# Patient Record
Sex: Male | Born: 1970 | Race: White | Hispanic: No | Marital: Married | Smoking: Never smoker
Health system: Southern US, Community
[De-identification: ages and names within clinical notes are randomized; demographics above are authoritative.]

## PROBLEM LIST (undated history)

## (undated) DIAGNOSIS — Z789 Other specified health status: Secondary | ICD-10-CM

## (undated) HISTORY — PX: NO PAST SURGERIES: SHX2092

---

## 2014-12-06 ENCOUNTER — Encounter (HOSPITAL_COMMUNITY): Payer: Self-pay | Admitting: Emergency Medicine

## 2014-12-06 ENCOUNTER — Observation Stay (HOSPITAL_COMMUNITY)
Admission: EM | Admit: 2014-12-06 | Discharge: 2014-12-07 | Disposition: A | Discharge status: Home / Self Care | Payer: Managed Care, Other (non HMO) | Attending: Internal Medicine | Admitting: Internal Medicine

## 2014-12-06 ENCOUNTER — Emergency Department (HOSPITAL_COMMUNITY): Payer: Managed Care, Other (non HMO)

## 2014-12-06 DIAGNOSIS — R9431 Abnormal electrocardiogram [ECG] [EKG]: Secondary | ICD-10-CM | POA: Insufficient documentation

## 2014-12-06 DIAGNOSIS — R0602 Shortness of breath: Secondary | ICD-10-CM | POA: Insufficient documentation

## 2014-12-06 DIAGNOSIS — R110 Nausea: Secondary | ICD-10-CM | POA: Diagnosis not present

## 2014-12-06 DIAGNOSIS — Z8249 Family history of ischemic heart disease and other diseases of the circulatory system: Secondary | ICD-10-CM | POA: Insufficient documentation

## 2014-12-06 DIAGNOSIS — R0789 Other chest pain: Secondary | ICD-10-CM

## 2014-12-06 DIAGNOSIS — Z791 Long term (current) use of non-steroidal anti-inflammatories (NSAID): Secondary | ICD-10-CM | POA: Diagnosis not present

## 2014-12-06 DIAGNOSIS — R079 Chest pain, unspecified: Secondary | ICD-10-CM | POA: Diagnosis not present

## 2014-12-06 DIAGNOSIS — E785 Hyperlipidemia, unspecified: Secondary | ICD-10-CM | POA: Insufficient documentation

## 2014-12-06 DIAGNOSIS — D72829 Elevated white blood cell count, unspecified: Secondary | ICD-10-CM | POA: Insufficient documentation

## 2014-12-06 HISTORY — DX: Other specified health status: Z78.9

## 2014-12-06 LAB — CBC
HEMATOCRIT: 48 % (ref 39.0–52.0)
Hemoglobin: 16.6 g/dL (ref 13.0–17.0)
MCH: 29.9 pg (ref 26.0–34.0)
MCHC: 34.6 g/dL (ref 30.0–36.0)
MCV: 86.3 fL (ref 78.0–100.0)
PLATELETS: 186 10*3/uL (ref 150–400)
RBC: 5.56 MIL/uL (ref 4.22–5.81)
RDW: 12.7 % (ref 11.5–15.5)
WBC: 12.9 10*3/uL — ABNORMAL HIGH (ref 4.0–10.5)

## 2014-12-06 LAB — BASIC METABOLIC PANEL
ANION GAP: 15 (ref 5–15)
BUN: 15 mg/dL (ref 6–23)
CHLORIDE: 101 meq/L (ref 96–112)
CO2: 23 mmol/L (ref 19–32)
CREATININE: 1.01 mg/dL (ref 0.50–1.35)
Calcium: 9.9 mg/dL (ref 8.4–10.5)
GFR calc Af Amer: >90 mL/min (ref >90)
GFR calc non Af Amer: 89 mL/min — ABNORMAL LOW (ref >90)
GLUCOSE: 120 mg/dL — AB (ref 70–99)
Potassium: 4.6 mmol/L (ref 3.5–5.1)
Sodium: 139 mmol/L (ref 135–145)

## 2014-12-06 LAB — I-STAT CG4 LACTIC ACID, ED: Lactic Acid, Venous: 0.84 mmol/L (ref 0.5–2.2)

## 2014-12-06 LAB — I-STAT TROPONIN, ED: TROPONIN I, POC: 0 ng/mL (ref 0.00–0.08)

## 2014-12-06 LAB — D-DIMER, QUANTITATIVE: D-Dimer, Quant: <0.27 ug/mL-FEU (ref 0.00–0.48)

## 2014-12-06 MED ORDER — KETOROLAC TROMETHAMINE 30 MG/ML IJ SOLN
30.0000 mg | Freq: Once | INTRAMUSCULAR | Status: AC
  Administered 2014-12-06: 30 mg via INTRAVENOUS
  Filled 2014-12-06: qty 1

## 2014-12-06 NOTE — ED Provider Notes (Signed)
CSN: [id number]     Arrival date & time 12/06/14  1755 History   First MD Initiated Contact with Patient 12/06/14 2031     Chief Complaint  Patient presents with  . Chest Pain      HPI  Patient presents with concern of ongoing chest pain.  Pain is left-sided, nonradiating, sore. Patient was seen 1 week ago, after the pain began, diagnosed with muscular pain, discharge with muscle relaxants. He notes that since that time he continues to have pain, has stopped using the relaxants secondary to side effects. The pain is nonexertional, but there is new diaphoresis, fatigue with exertion. The patient was well prior to the onset of symptoms, denies notable events previously. He denies travel history, new medications, new diet. Patient does not smoke, drinks socially. No history of cardiac disease.   History reviewed. No pertinent past medical history. History reviewed. No pertinent past surgical history. No family history on file. History  Substance Use Topics  . Smoking status: Never Smoker   . Smokeless tobacco: Not on file  . Alcohol Use: Yes    Review of Systems  Constitutional:       Per HPI, otherwise negative  HENT:       Per HPI, otherwise negative  Respiratory:       Per HPI, otherwise negative  Cardiovascular:       Per HPI, otherwise negative  Gastrointestinal: Negative for vomiting.  Endocrine:       Negative aside from HPI  Genitourinary:       Neg aside from HPI   Musculoskeletal:       Per HPI, otherwise negative  Skin: Negative.   Neurological: Negative for syncope.      Allergies  Review of patient's allergies indicates no known allergies.  Home Medications   Prior to Admission medications   Medication Sig Start Date End Date Taking? Authorizing Provider  cyclobenzaprine (FLEXERIL) 10 MG tablet Take 10 mg by mouth 3 (three) times daily as needed. 11/30/14  Yes Historical Provider, MD  ibuprofen (ADVIL,MOTRIN) 200 MG tablet Take 400 mg by mouth  every 6 (six) hours as needed for moderate pain.    Yes Historical Provider, MD  naproxen (NAPROSYN) 500 MG tablet Take 500 mg by mouth 2 (two) times daily. 11/30/14  Yes Historical Provider, MD   BP 134/88 mmHg  Pulse 76  Temp(Src) 98.2 F (36.8 C) (Oral)  Resp 18  Ht  (1.778 m)  Wt 195 lb (88.451 kg)  BMI 27.98 kg/m2  SpO2 98% Physical Exam  Constitutional: He is oriented to person, place, and time. He appears well-developed. No distress.  HENT:  Head: Normocephalic and atraumatic.  Eyes: Conjunctivae and EOM are normal.  Cardiovascular: Normal rate and regular rhythm.   Pulmonary/Chest: Effort normal. No stridor. No respiratory distress.  Abdominal: He exhibits no distension.  Musculoskeletal: He exhibits no edema.  Neurological: He is alert and oriented to person, place, and time.  Skin: Skin is warm and dry.  Psychiatric: He has a normal mood and affect.  Nursing note and vitals reviewed.   ED Course  Procedures (including critical care time) Labs Review Labs Reviewed  CBC - Abnormal; Notable for the following:    WBC 12.9 (*)    All other components within normal limits  BASIC METABOLIC PANEL - Abnormal; Notable for the following:    Glucose, Bld 120 (*)    GFR calc non Af Amer 89 (*)    All other components  within normal limits  D-DIMER, QUANTITATIVE  I-STAT TROPOININ, ED  I-STAT CG4 LACTIC ACID, ED    Imaging Review Dg Chest 2 View  12/06/2014   CLINICAL DATA:  LEFT chest pain radiating to arm. Shortness of breath, nausea.  EXAM: CHEST  2 VIEW  COMPARISON:  None.  FINDINGS: Cardiomediastinal silhouette is unremarkable. The lungs are clear without pleural effusions or focal consolidations. Trachea projects midline and there is no pneumothorax. Soft tissue planes and included osseous structures are non-suspicious.  IMPRESSION: Normal chest.   Electronically Signed   By: Awilda Metroourtnay  Bloomer   On: 12/06/2014 22:07     EKG Interpretation   Date/Time:  Sunday  December 06 2014 17:59:38 EST Ventricular Rate:  88 PR Interval:  154 QRS Duration: 88 QT Interval:  330 QTC Calculation: 399 R Axis:   42 Text Interpretation:  Normal sinus rhythm ST \\T\\ T wave abnormality,  consider inferior ischemia Abnormal ECG Sinus rhythm ST-t wave abnormality  Abnormal ekg Confirmed by Vennessa Affinito  MD ([id number]) on 12/06/2014 8:38:08  PM     10 :46 PM    MDM   Final diagnoses:  Atypical chest pain   patient presents with ongoing left-sided chest pain, as well as new exertional diaphoresis. Here the patient's initial evaluation is reassuring, with negative troponin, d-dimer, reassuring labs, x-ray. However, given the patient's absence of prior workup, his new exertional symptoms, he was admitted for further evaluation and management.    Gerhard Munchobert Easter Schinke, MD 12/06/14 [phone]

## 2014-12-06 NOTE — ED Notes (Signed)
Pt c/o left sided chest pain intermittent for several days. Pt seen at urgent care and was told it was muscular. Pt was given muscle relaxers. Pt reports muscle relaxers made him feel out of it so he stopped taking them.

## 2014-12-07 ENCOUNTER — Encounter (HOSPITAL_COMMUNITY): Payer: Self-pay | Admitting: Internal Medicine

## 2014-12-07 DIAGNOSIS — E785 Hyperlipidemia, unspecified: Secondary | ICD-10-CM | POA: Diagnosis present

## 2014-12-07 DIAGNOSIS — R079 Chest pain, unspecified: Secondary | ICD-10-CM

## 2014-12-07 DIAGNOSIS — D72829 Elevated white blood cell count, unspecified: Secondary | ICD-10-CM | POA: Diagnosis present

## 2014-12-07 DIAGNOSIS — R0789 Other chest pain: Secondary | ICD-10-CM | POA: Insufficient documentation

## 2014-12-07 DIAGNOSIS — R072 Precordial pain: Secondary | ICD-10-CM

## 2014-12-07 LAB — COMPREHENSIVE METABOLIC PANEL
ALK PHOS: 45 U/L (ref 39–117)
ALT: 38 U/L (ref 0–53)
ANION GAP: 12 (ref 5–15)
AST: 42 U/L — ABNORMAL HIGH (ref 0–37)
Albumin: 4.1 g/dL (ref 3.5–5.2)
BUN: 17 mg/dL (ref 6–23)
CHLORIDE: 102 meq/L (ref 96–112)
CO2: 23 mmol/L (ref 19–32)
CREATININE: 1.13 mg/dL (ref 0.50–1.35)
Calcium: 9.1 mg/dL (ref 8.4–10.5)
GFR calc Af Amer: >90 mL/min (ref >90)
GFR calc non Af Amer: 78 mL/min — ABNORMAL LOW (ref >90)
Glucose, Bld: 142 mg/dL — ABNORMAL HIGH (ref 70–99)
Potassium: 3.6 mmol/L (ref 3.5–5.1)
SODIUM: 137 mmol/L (ref 135–145)
TOTAL PROTEIN: 6.7 g/dL (ref 6.0–8.3)
Total Bilirubin: 0.7 mg/dL (ref 0.3–1.2)

## 2014-12-07 LAB — CBC WITH DIFFERENTIAL/PLATELET
BASOS ABS: 0 10*3/uL (ref 0.0–0.1)
Basophils Relative: 0 % (ref 0–1)
Eosinophils Absolute: 0.3 10*3/uL (ref 0.0–0.7)
Eosinophils Relative: 2 % (ref 0–5)
HCT: 44.9 % (ref 39.0–52.0)
Hemoglobin: 15.4 g/dL (ref 13.0–17.0)
Lymphocytes Relative: 23 % (ref 12–46)
Lymphs Abs: 2.8 10*3/uL (ref 0.7–4.0)
MCH: 29.7 pg (ref 26.0–34.0)
MCHC: 34.3 g/dL (ref 30.0–36.0)
MCV: 86.5 fL (ref 78.0–100.0)
MONO ABS: 1.2 10*3/uL — AB (ref 0.1–1.0)
Monocytes Relative: 10 % (ref 3–12)
Neutro Abs: 7.9 10*3/uL — ABNORMAL HIGH (ref 1.7–7.7)
Neutrophils Relative %: 65 % (ref 43–77)
Platelets: 178 10*3/uL (ref 150–400)
RBC: 5.19 MIL/uL (ref 4.22–5.81)
RDW: 12.9 % (ref 11.5–15.5)
WBC: 12.1 10*3/uL — ABNORMAL HIGH (ref 4.0–10.5)

## 2014-12-07 LAB — CREATININE, SERUM
Creatinine, Ser: 1.13 mg/dL (ref 0.50–1.35)
GFR calc Af Amer: >90 mL/min (ref >90)
GFR, EST NON AFRICAN AMERICAN: 78 mL/min — AB (ref >90)

## 2014-12-07 LAB — RAPID URINE DRUG SCREEN, HOSP PERFORMED
AMPHETAMINES: NOT DETECTED
BENZODIAZEPINES: NOT DETECTED
Barbiturates: NOT DETECTED
COCAINE: NOT DETECTED
OPIATES: NOT DETECTED
Tetrahydrocannabinol: NOT DETECTED

## 2014-12-07 LAB — LIPID PANEL
CHOLESTEROL: 251 mg/dL — AB (ref 0–200)
HDL: 34 mg/dL — ABNORMAL LOW (ref >39)
LDL CALC: 177 mg/dL — AB (ref 0–99)
Total CHOL/HDL Ratio: 7.4 RATIO
Triglycerides: 198 mg/dL — ABNORMAL HIGH (ref <150)
VLDL: 40 mg/dL (ref 0–40)

## 2014-12-07 LAB — TROPONIN I
Troponin I: <0.03 ng/mL (ref <0.031)
Troponin I: <0.03 ng/mL (ref <0.031)

## 2014-12-07 MED ORDER — ASPIRIN EC 325 MG PO TBEC
325.0000 mg | DELAYED_RELEASE_TABLET | Freq: Every day | ORAL | Status: DC
  Administered 2014-12-07: 325 mg via ORAL
  Filled 2014-12-07: qty 1

## 2014-12-07 MED ORDER — ONDANSETRON HCL 4 MG/2ML IJ SOLN: 4.0000 mg | Freq: Four times a day (QID) | INTRAMUSCULAR | Status: DC | PRN

## 2014-12-07 MED ORDER — SODIUM CHLORIDE 0.9 % IV SOLN
INTRAVENOUS | Status: DC
  Administered 2014-12-07: 03:00:00 via INTRAVENOUS

## 2014-12-07 MED ORDER — MORPHINE SULFATE 2 MG/ML IJ SOLN: 2.0000 mg | INTRAMUSCULAR | Status: DC | PRN

## 2014-12-07 MED ORDER — PRAVASTATIN SODIUM 10 MG PO TABS: 10.0000 mg | ORAL_TABLET | Freq: Every day | ORAL | Status: AC

## 2014-12-07 MED ORDER — POTASSIUM CHLORIDE 10 MEQ/100ML IV SOLN: 10.0000 meq | Freq: Once | INTRAVENOUS | Status: DC

## 2014-12-07 MED ORDER — ACETAMINOPHEN 325 MG PO TABS: 650.0000 mg | ORAL_TABLET | ORAL | Status: DC | PRN

## 2014-12-07 MED ORDER — POTASSIUM CHLORIDE 10 MEQ/100ML IV SOLN: 10.0000 meq | INTRAVENOUS | Status: DC

## 2014-12-07 MED ORDER — SODIUM CHLORIDE 0.9 % IV SOLN
INTRAVENOUS | Status: AC
  Administered 2014-12-07: 02:00:00 via INTRAVENOUS

## 2014-12-07 MED ORDER — ENOXAPARIN SODIUM 40 MG/0.4ML ~~LOC~~ SOLN
40.0000 mg | Freq: Every day | SUBCUTANEOUS | Status: DC
  Administered 2014-12-07: 40 mg via SUBCUTANEOUS
  Filled 2014-12-07 (×2): qty 0.4

## 2014-12-07 NOTE — ED Notes (Signed)
Pt updated on plan of care. Awaiting disposition. Pt denies chest pain at this time

## 2014-12-07 NOTE — ED Notes (Signed)
Spoke with Dr. Rito EhrlichKrishnan and he advised okay to cancel bed request.   Patient will be discharged after he gets troponin back and after echo, provided all shows well.

## 2014-12-07 NOTE — Progress Notes (Signed)
*  PRELIMINARY RESULTS* Echocardiogram 2D Echocardiogram has been performed.  Jeryl ColumbiaLLIOTT, [place] 12/07/2014, 2:17 PM

## 2014-12-07 NOTE — Progress Notes (Signed)
UR completed 

## 2014-12-07 NOTE — ED Notes (Signed)
Pt made aware to return if symptoms worsen or if any life threatening symptoms occur.   

## 2014-12-07 NOTE — ED Notes (Signed)
Bedside echo being performed at this time

## 2014-12-07 NOTE — Discharge Summary (Signed)
Discharge Summary  Alexander DissChristopher D Sullivan ZOX:[id number] DOB: 07-19-1971  PCP: Patient with no primary care physician, given referrals  Admit date: 12/06/2014 Discharge date: 12/07/2014  Time spent: 35 minutes  Recommendations for Outpatient Follow-up:  1. New medication: Pravastatin 10 mg by mouth daily at bedtime   Discharge Diagnoses:  Active Hospital Problems   Diagnosis Date Noted  . Chest pain 12/06/2014  . Leucocytosis 12/07/2014  . Hyperlipidemia 12/07/2014    Resolved Hospital Problems   Diagnosis Date Noted Date Resolved  No resolved problems to display.    Discharge Condition: Improved, being discharged home  Diet recommendation: Regular diet  Filed Weights   12/06/14 1801  Weight: 88.451 kg (195 lb)    History of present illness:  44 year old male with no past medical history came to the emergency room on 1/10 night with complaints of left-sided chest pain 1 week. Described as both a sharp pain but also pressure and he was concerned what it felt like he was moving down his left hand. No associated shortness of breath. EKG noted questionable T-wave inversion in the inferior leads. Cardiac markers initially were negative.  Hospital Course:  Principal Problem:   Chest pain: Patient's heart score was 2. Enzymes 3 negative. Echocardiogram done unrevealing. Repeat EKG was normal. Lipid panel fasting noted LDL of 177. Patient's chest pain felt to be more musculoskeletal versus anxiety. D-dimer normal. He was discharged home. Active Problems:   Leucocytosis: Afebrile with no shift. Possible reactionary, no evidence of pneumonia on chest x-ray   Hyperlipidemia: Given LDL, prescription for pravastatin given   Procedures:  Echocardiogram done 1/11: No evidence of systolic or diastolic dysfunction, no valvular abnormalities. Normal.  Consultations:  None  Discharge Exam: BP 120/70 mmHg  Pulse 76  Temp(Src) 98.4 F (36.9 C) (Oral)  Resp 16  Ht 5\' 10"  (1.778 m)   Wt 88.451 kg (195 lb)  BMI 27.98 kg/m2  SpO2 96%  General: Alert and oriented 3, no acute distress Cardiovascular: Regular rate and rhythm, S1-S2 Respiratory: Clear to auscultation bilaterally  Discharge Instructions You were cared for by a hospitalist during your hospital stay. If you have any questions about your discharge medications or the care you received while you were in the hospital after you are discharged, you can call the unit and asked to speak with the hospitalist on call if the hospitalist that took care of you is not available. Once you are discharged, your primary care physician will handle any further medical issues. Please note that NO REFILLS for any discharge medications will be authorized once you are discharged, as it is imperative that you return to your primary care physician (or establish a relationship with a primary care physician if you do not have one) for your aftercare needs so that they can reassess your need for medications and monitor your lab values.  Discharge Instructions    Diet general    Complete by:  As directed      Increase activity slowly    Complete by:  As directed             Medication List    STOP taking these medications        ibuprofen 200 MG tablet  Commonly known as:  ADVIL,MOTRIN     naproxen 500 MG tablet  Commonly known as:  NAPROSYN      TAKE these medications        cyclobenzaprine 10 MG tablet  Commonly known as:  FLEXERIL  Take 10  mg by mouth 3 (three) times daily as needed.     pravastatin 10 MG tablet  Commonly known as:  PRAVACHOL  Take 1 tablet (10 mg total) by mouth daily.       No Known Allergies    The results of significant diagnostics from this hospitalization (including imaging, microbiology, ancillary and laboratory) are listed below for reference.    Significant Diagnostic Studies: Dg Chest 2 View  12/06/2014   CLINICAL DATA:  LEFT chest pain radiating to arm. Shortness of breath, nausea.   EXAM: CHEST  2 VIEW  COMPARISON:  None.  FINDINGS: Cardiomediastinal silhouette is unremarkable. The lungs are clear without pleural effusions or focal consolidations. Trachea projects midline and there is no pneumothorax. Soft tissue planes and included osseous structures are non-suspicious.  IMPRESSION: Normal chest.   Electronically Signed   By: Awilda Metro   On: 12/06/2014 22:07    Microbiology: No results found for this or any previous visit (from the past 240 hour(s)).   Labs: Basic Metabolic Panel:  Recent Labs Lab 12/06/14 1838 12/07/14 0230  NA 139 137  K 4.6 3.6  CL 101 102  CO2 23 23  GLUCOSE 120* 142*  BUN 15 17  CREATININE 1.01 1.13  1.13  CALCIUM 9.9 9.1   Liver Function Tests:  Recent Labs Lab 12/07/14 0230  AST 42*  ALT 38  ALKPHOS 45  BILITOT 0.7  PROT 6.7  ALBUMIN 4.1   No results for input(s): LIPASE, AMYLASE in the last 168 hours. No results for input(s): AMMONIA in the last 168 hours. CBC:  Recent Labs Lab 12/06/14 1838 12/07/14 0230  WBC 12.9* 12.1*  NEUTROABS  --  7.9*  HGB 16.6 15.4  HCT 48.0 44.9  MCV 86.3 86.5  PLT 186 178   Cardiac Enzymes:  Recent Labs Lab 12/07/14 0230 12/07/14 0801 12/07/14 1415  TROPONINI <0.03 <0.03 <0.03   BNP: BNP (last 3 results) No results for input(s): PROBNP in the last 8760 hours. CBG: No results for input(s): GLUCAP in the last 168 hours.     Signed:  Hollice Espy  [facility] 12/07/2014, 8:10 PM

## 2014-12-07 NOTE — H&P (Signed)
[facility] History and Physical  Alexander Sullivan [id number] DOB: 08/26/1971 DOA: 12/06/2014  Referring physician: ER physician. PCP: No primary care provider on file.   Chief Complaint: Chest pain.  HPI: Alexander Sullivan is a 44 y.o. male with no significant past medical history presents to the ER because of chest pain. Patient states he has been having chest pain over the last 1 week. 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 Patient had initially chest pain which increased on moving his left hand and had gone to urgent care and was placed on NSAIDs and muscle relaxant. Despite taking which patient was still getting chest pain which is mostly left-sided pressure-like nonradiating. Denies any associated shortness of breath. History patient also had some brief episode of diaphoresis. EKG was showing T-wave inversion in the inferior leads. Cardiac markers were negative and patient is currently chest pain-free and admitted for further observation. Patient denies any abdominal pain vomiting or diarrhea. Denies any fever chills or productive cough.   Review of Systems: As presented in the history of presenting illness, rest negative.  Past Medical History  Diagnosis Date  . Medical history non-contributory    Past Surgical History  Procedure Laterality Date  . No past surgeries     Social History:  reports that he has never smoked. He does not have any smokeless tobacco history on file. He reports that he drinks alcohol. He reports that he does not use illicit drugs. Where does patient live home. Can patient participate in ADLs? Yes.  No Known Allergies  Family History:  Family History  Problem Relation Age of Onset  . CAD Neg Hx       Prior to Admission medications   Medication Sig Start Date End Date Taking? Authorizing Provider  cyclobenzaprine (FLEXERIL) 10 MG tablet Take 10 mg by mouth 3 (three) times daily as needed. 11/30/14  Yes Historical Provider, MD  ibuprofen  (ADVIL,MOTRIN) 200 MG tablet Take 400 mg by mouth every 6 (six) hours as needed for moderate pain.    Yes Historical Provider, MD  naproxen (NAPROSYN) 500 MG tablet Take 500 mg by mouth 2 (two) times daily. 11/30/14  Yes Historical Provider, MD    Physical Exam: Filed Vitals:   12/06/14 2100 12/06/14 2130 12/07/14 0011 12/07/14 0030  BP: 122/74 119/78 129/78 118/76  Pulse: 70 67 81 73  Temp:      TempSrc:   Oral   Resp: 12 12 18 17   Height:      Weight:      SpO2: 97% 96% 94% 94%     General:  Well-developed and nourished.  Eyes: Anicteric no pallor.  ENT: No discharge from the ears eyes nose and mouth.  Neck: No JVD appreciated no mass felt.  Cardiovascular: S1 and S2 heard.  Respiratory: No rhonchi or crepitations.  Abdomen: Soft nontender bowel sounds present.  Skin: No rash.  Musculoskeletal: No edema.  Psychiatric: Appears normal.  Neurologic: Alert awake oriented to time place and person. Moves all extremities.  Labs on Admission:  Basic Metabolic Panel:  Recent Labs Lab 12/06/14 1838  NA 139  K 4.6  CL 101  CO2 23  GLUCOSE 120*  BUN 15  CREATININE 1.01  CALCIUM 9.9   Liver Function Tests: No results for input(s): AST, ALT, ALKPHOS, BILITOT, PROT, ALBUMIN in the last 168 hours. No results for input(s): LIPASE, AMYLASE in the last 168 hours. No results for input(s): AMMONIA in the last 168 hours. CBC:  Recent Labs  Lab 12/06/14 1838  WBC 12.9*  HGB 16.6  HCT 48.0  MCV 86.3  PLT 186   Cardiac Enzymes: No results for input(s): CKTOTAL, CKMB, CKMBINDEX, TROPONINI in the last 168 hours.  BNP (last 3 results) No results for input(s): PROBNP in the last 8760 hours. CBG: No results for input(s): GLUCAP in the last 168 hours.  Radiological Exams on Admission: Dg Chest 2 View  12/06/2014   CLINICAL DATA:  LEFT chest pain radiating to arm. Shortness of breath, nausea.  EXAM: CHEST  2 VIEW  COMPARISON:  None.  FINDINGS: Cardiomediastinal  silhouette is unremarkable. The lungs are clear without pleural effusions or focal consolidations. Trachea projects midline and there is no pneumothorax. Soft tissue planes and included osseous structures are non-suspicious.  IMPRESSION: Normal chest.   Electronically Signed   By: Awilda Metro   On: 12/06/2014 22:07    EKG: Independently reviewed. Normal sinus rhythm with T-wave inversion in lead 2 and aVF.  Assessment/Plan Principal Problem:   Chest pain Active Problems:   Leucocytosis   1. Chest pain - given that patient has had no relief with NSAIDs and has had some diaphoresis today with chest pain with some nonspecific T-wave changes in inferior leads we'll cycle cardiac markers to rule out ACS. Check 2-D echo. Keep patient nothing by mouth past 5 AM. For possible cardiac procedures. 2. Leukocytosis - probably reactionary. Patient is afebrile. Closely follow.   DVT Prophylaxis Lovenox.  Code Status: Full code.  Family Communication: Patient's wife at the bedside.  Disposition Plan: Admit for observation.    Tremane Spurgeon N. [facility] Pager [phone].  If 7PM-7AM, please contact night-coverage [email or web address] Password [email or web address] 12/07/2014, 1:25 AM

## 2014-12-07 NOTE — ED Notes (Signed)
Spoke with echo and they will come get patient some time before 1400 today.

## 2015-02-01 ENCOUNTER — Other Ambulatory Visit (INDEPENDENT_AMBULATORY_CARE_PROVIDER_SITE_OTHER): Payer: Self-pay | Admitting: General Surgery

## 2015-02-01 ENCOUNTER — Other Ambulatory Visit (INDEPENDENT_AMBULATORY_CARE_PROVIDER_SITE_OTHER): Payer: Self-pay | Admitting: *Deleted

## 2015-02-01 DIAGNOSIS — R1011 Right upper quadrant pain: Secondary | ICD-10-CM

## 2015-02-01 NOTE — Addendum Note (Signed)
Addended by: Ernestene MentionINGRAM, Keyla Milone M on: 02/01/2015 06:10 PM   Modules accepted: Orders

## 2015-02-02 ENCOUNTER — Other Ambulatory Visit (INDEPENDENT_AMBULATORY_CARE_PROVIDER_SITE_OTHER): Payer: Self-pay | Admitting: *Deleted

## 2015-02-02 DIAGNOSIS — R109 Unspecified abdominal pain: Secondary | ICD-10-CM

## 2015-02-02 DIAGNOSIS — R1011 Right upper quadrant pain: Secondary | ICD-10-CM

## 2015-02-05 ENCOUNTER — Ambulatory Visit
Admission: RE | Admit: 2015-02-05 | Discharge: 2015-02-05 | Disposition: A | Discharge status: Home / Self Care | Payer: Managed Care, Other (non HMO) | Source: Ambulatory Visit | Attending: General Surgery | Admitting: General Surgery

## 2015-03-10 ENCOUNTER — Other Ambulatory Visit: Payer: Self-pay | Admitting: General Surgery

## 2016-02-09 IMAGING — RF DG UGI W/ HIGH DENSITY W/KUB
19 of 24 series · 19 of 24 positions shown · non-contrast
Comparison: Chest radiographs 12/06/2014.

CLINICAL DATA: 44-year-old male with right side abdominal pain.
Planned cholecystectomy. Initial encounter.

EXAM:
UPPER GI SERIES WITH KUB
TECHNIQUE: After obtaining a scout radiograph a routine upper GI series was
performed using barium
FLUOROSCOPY TIME:  Radiation Exposure Index (as provided by the
fluoroscopic device): 81 d8NcmY
If the device does not provide the exposure index:
Fluoroscopy Time (in minutes and seconds):  2 minutes and 36 seconds
Number of Acquired Images:  To

[Series 1: run · 1 of 1 slices shown (1 of 19)]
[im 1/1]
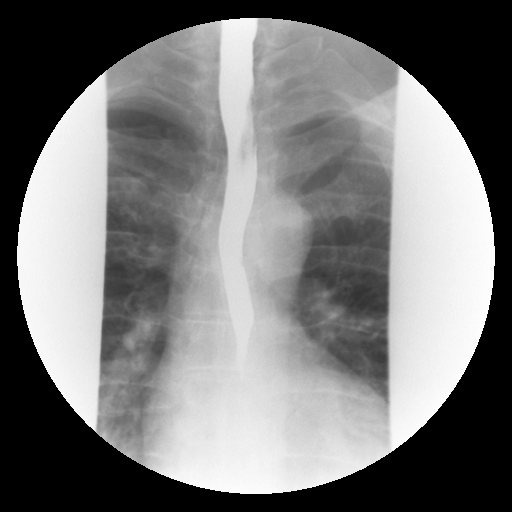

[Series 2: run · 1 of 1 slices shown (2 of 19)]
[im 1/1]
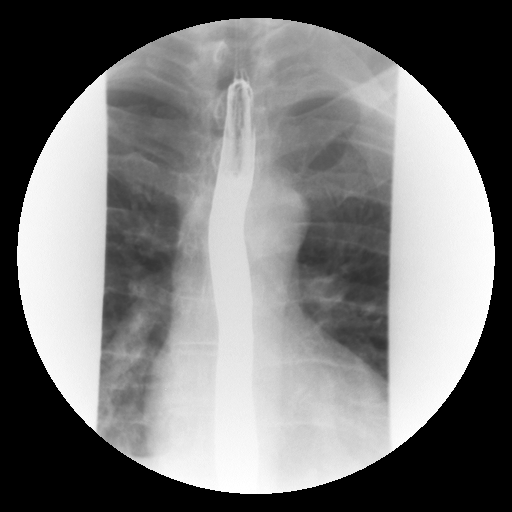

[Series 4: run · 1 of 1 slices shown (3 of 19)]
[im 1/1]
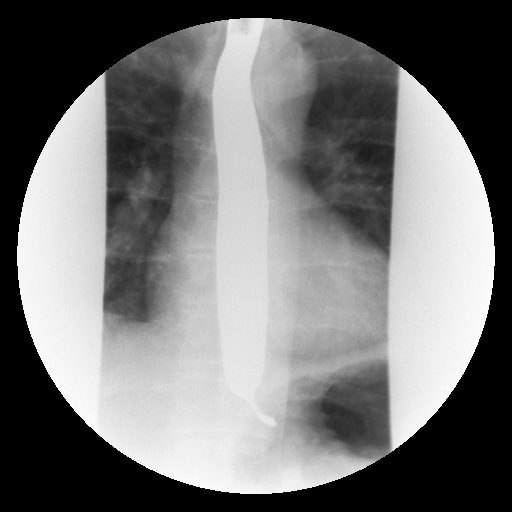

[Series 5: run · 1 of 1 slices shown (4 of 19)]
[im 1/1]
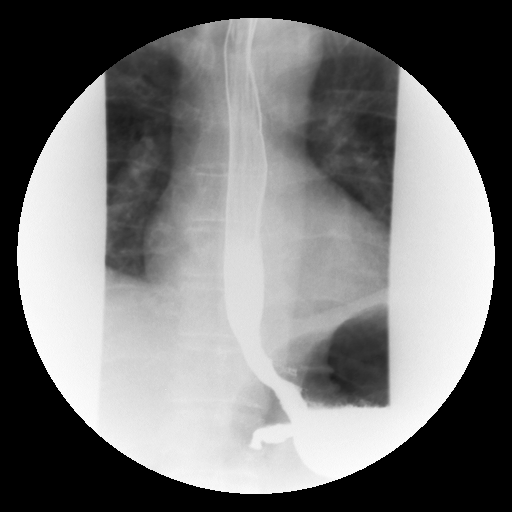

[Series 6: run · 1 of 1 slices shown (5 of 19)]
[im 1/1]
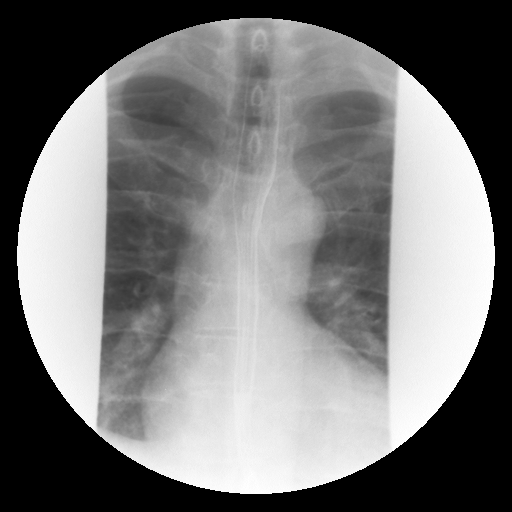

[Series 7: run · 1 of 1 slices shown (6 of 19)]
[im 1/1]
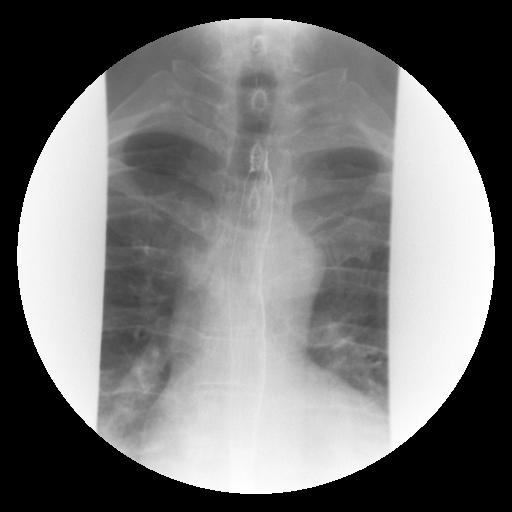

[Series 9: run · 1 of 1 slices shown (7 of 19)]
[im 1/1]
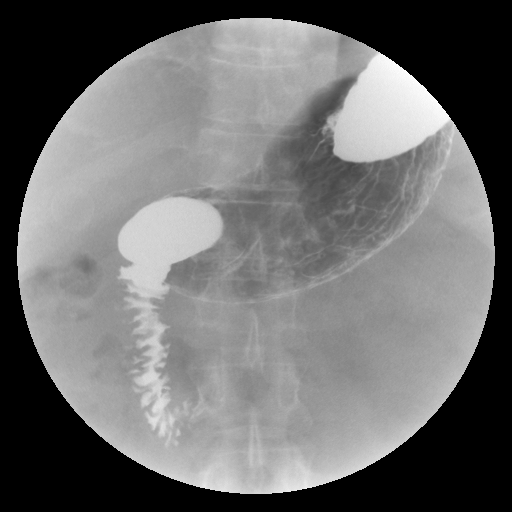

[Series 10: run · 1 of 1 slices shown (8 of 19)]
[im 1/1]
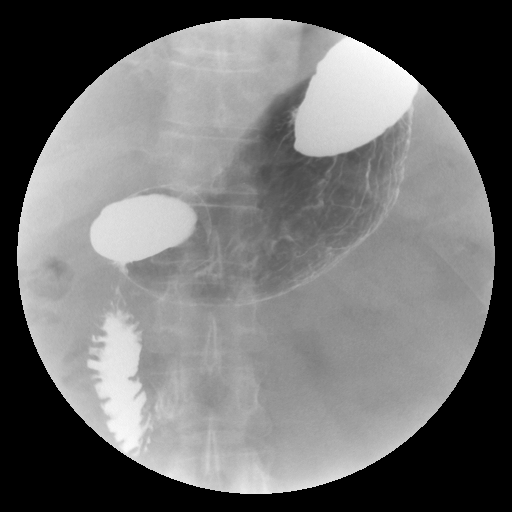

[Series 11: run · 1 of 1 slices shown (9 of 19)]
[im 1/1]
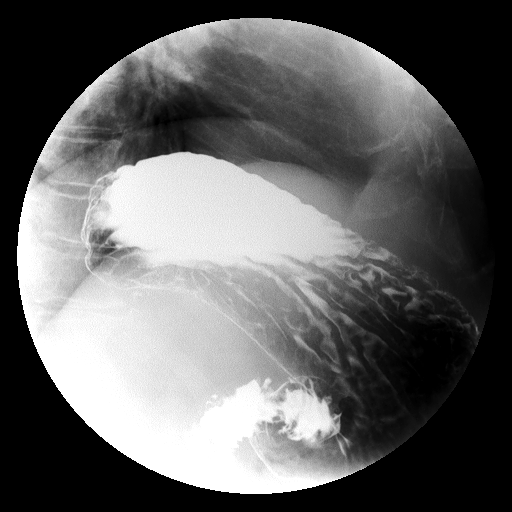

[Series 13: run · 1 of 1 slices shown (10 of 19)]
[im 1/1]
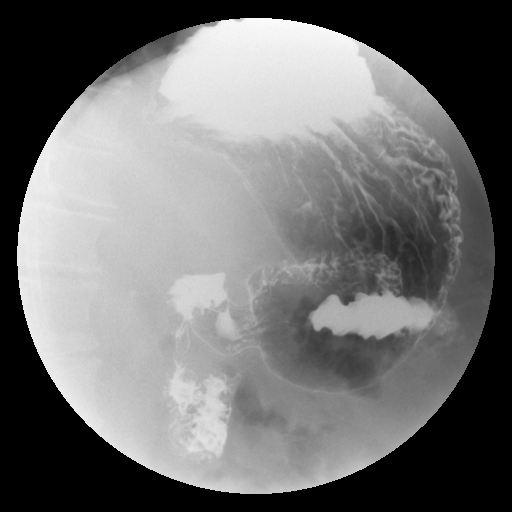

[Series 14: run · 1 of 1 slices shown (11 of 19)]
[im 1/1]
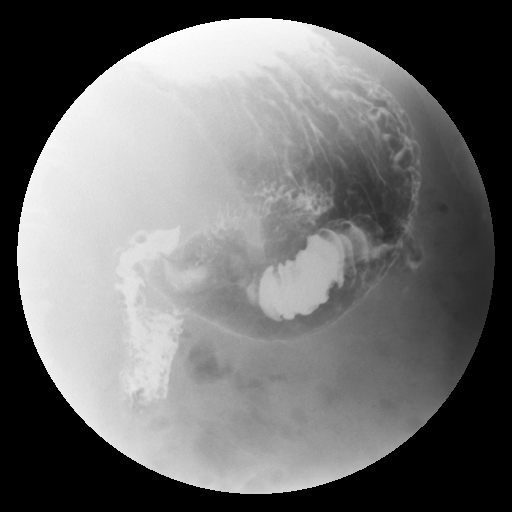

[Series 15: run · 1 of 1 slices shown (12 of 19)]
[im 1/1]
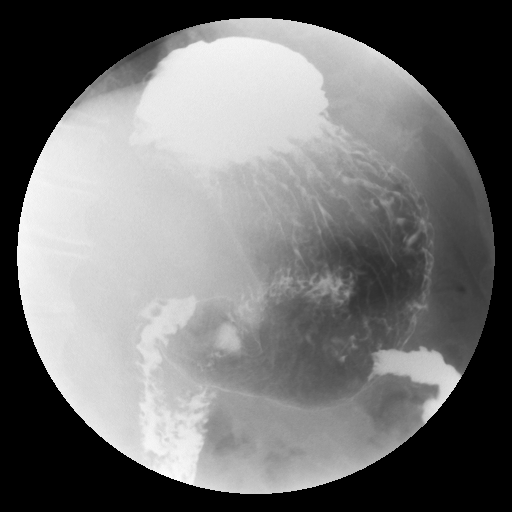

[Series 16: run · 1 of 1 slices shown (13 of 19)]
[im 1/1]
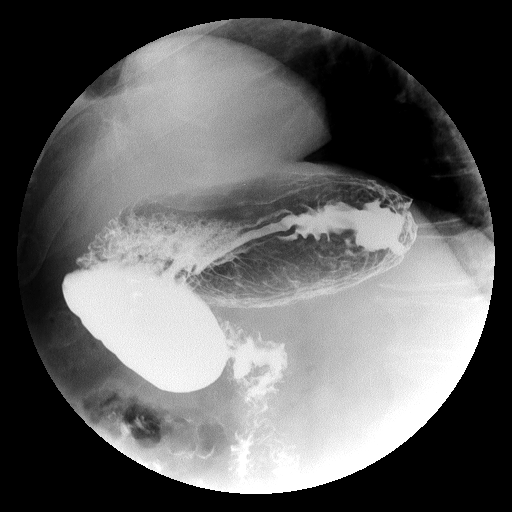

[Series 18: run · 1 of 1 slices shown (14 of 19)]
[im 1/1]
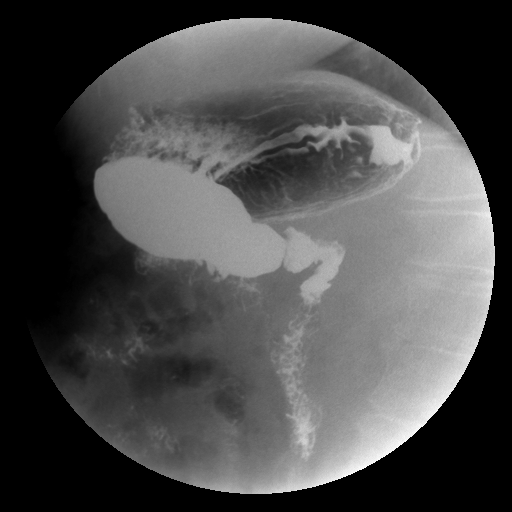

[Series 19: run · 1 of 1 slices shown (15 of 19)]
[im 1/1]
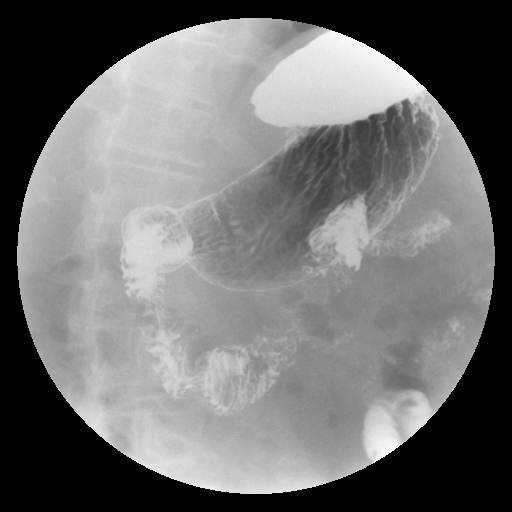

[Series 20: run · 1 of 1 slices shown (16 of 19)]
[im 1/1]
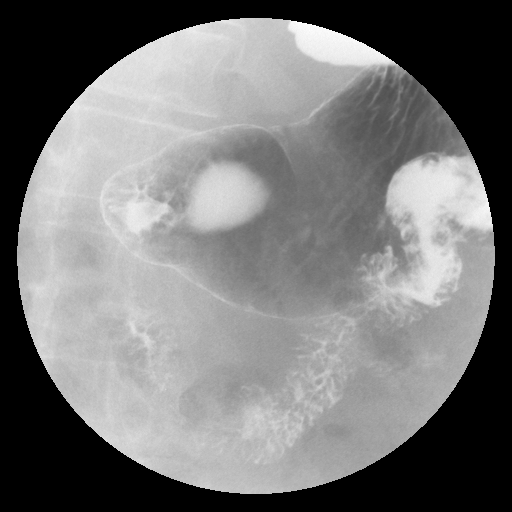

[Series 21: run · 1 of 1 slices shown (17 of 19)]
[im 1/1]
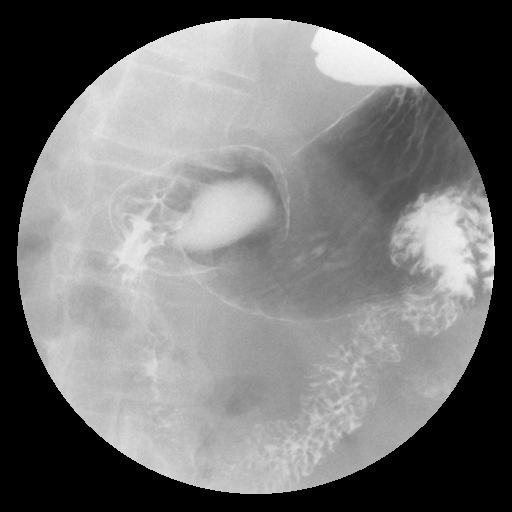

[Series 23: run · 1 of 1 slices shown (18 of 19)]
[im 1/1]
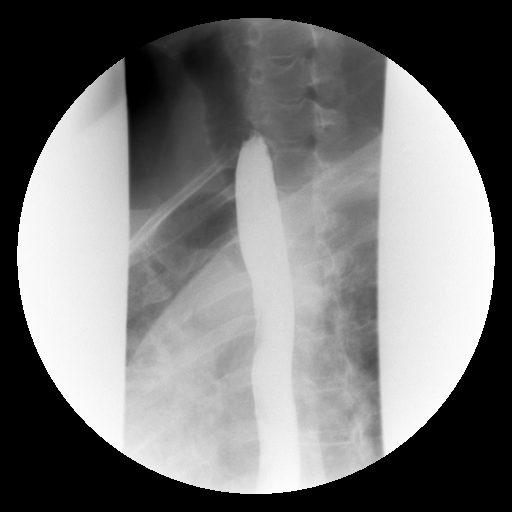

[Series 24: run · 1 of 1 slices shown (19 of 19)]
[im 1/1]
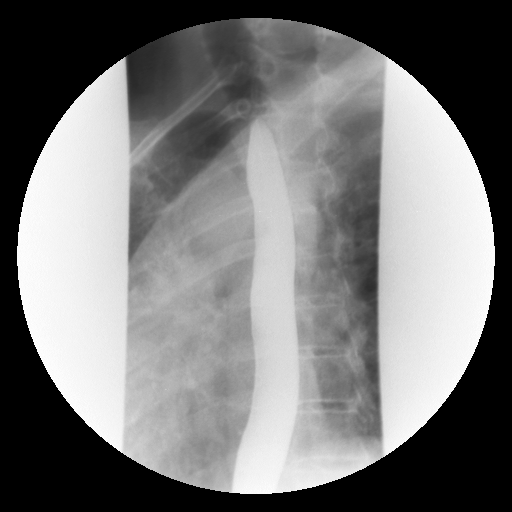

[19 of 24 positions shown; findings below may reference images not displayed]

FINDINGS: Preprocedural scout view of the abdomen. Non obstructed bowel gas
pattern. Visible abdominal and pelvic visceral contours are within
normal limits. Partially visible 2-3 cm rim calcified structure in
the right upper quadrant which most likely is a gallstone in this
setting.

No osseous abnormality identified.

A double contrast study was undertaken and the patient tolerated
this well and without difficulty.

No obstruction to the forward flow of contrast throughout the
esophagus and into the stomach. Normal esophageal course and
contour. Normal esophageal mucosal pattern.

Good gastric coating with barium. Prompt gastric emptying. Normal
gastric mucosal pattern.

Duodenum bulb and C loop within normal limits. Duodenum mucosal
pattern within normal limits.

Normal esophageal motility. Normal gastroesophageal junction. No
gastroesophageal reflux occurred spontaneously or was elicited.
IMPRESSION: 1. Negative upper GI.
2. 2-3 cm rim calcified gallstone suspected in the right upper
quadrant.

## 2021-03-28 LAB — EXTERNAL GENERIC LAB PROCEDURE: COLOGUARD: NEGATIVE

## 2021-03-28 LAB — COLOGUARD: COLOGUARD: NEGATIVE

## 2024-03-28 ENCOUNTER — Other Ambulatory Visit: Payer: Self-pay

## 2024-03-28 DIAGNOSIS — M5030 Other cervical disc degeneration, unspecified cervical region: Secondary | ICD-10-CM

## 2024-03-28 DIAGNOSIS — M961 Postlaminectomy syndrome, not elsewhere classified: Secondary | ICD-10-CM

## 2024-03-28 DIAGNOSIS — M4722 Other spondylosis with radiculopathy, cervical region: Secondary | ICD-10-CM

## 2024-04-15 ENCOUNTER — Ambulatory Visit

## 2024-04-15 DIAGNOSIS — M5030 Other cervical disc degeneration, unspecified cervical region: Secondary | ICD-10-CM

## 2024-04-15 DIAGNOSIS — M4722 Other spondylosis with radiculopathy, cervical region: Secondary | ICD-10-CM

## 2024-04-15 DIAGNOSIS — M961 Postlaminectomy syndrome, not elsewhere classified: Secondary | ICD-10-CM | POA: Diagnosis not present
